# Patient Record
Sex: Male | Born: 1995 | Race: Black or African American | Hispanic: No | Marital: Single | Smoking: Current every day smoker
Health system: Southern US, Community
[De-identification: ages and names within clinical notes are randomized; demographics above are authoritative.]

## PROBLEM LIST (undated history)

## (undated) DIAGNOSIS — J45909 Unspecified asthma, uncomplicated: Secondary | ICD-10-CM

---
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
## 2016-06-30 IMAGING — CT CT TEMPORAL BONES WITHOUT CONTRAST
2 of 6 series · 12 of 40 positions shown, 15 images · non-contrast
Comparison: CT temporal bone 03/11/2014

CLINICAL DATA: Conductive hearing loss left ear. Postop left ear
surgery

EXAM:
CT TEMPORAL BONES WITHOUT CONTRAST
TECHNIQUE: Axial and coronal plane CT imaging of the petrous temporal bones was
performed with thin-collimation image reconstruction. No intravenous
contrast was administered. Multiplanar CT image reconstructions were
also generated.

[Series 4: coronal bone · coronal · 0.11mm/px · 2 of 146 slices shown]
[im 49/146  bone]
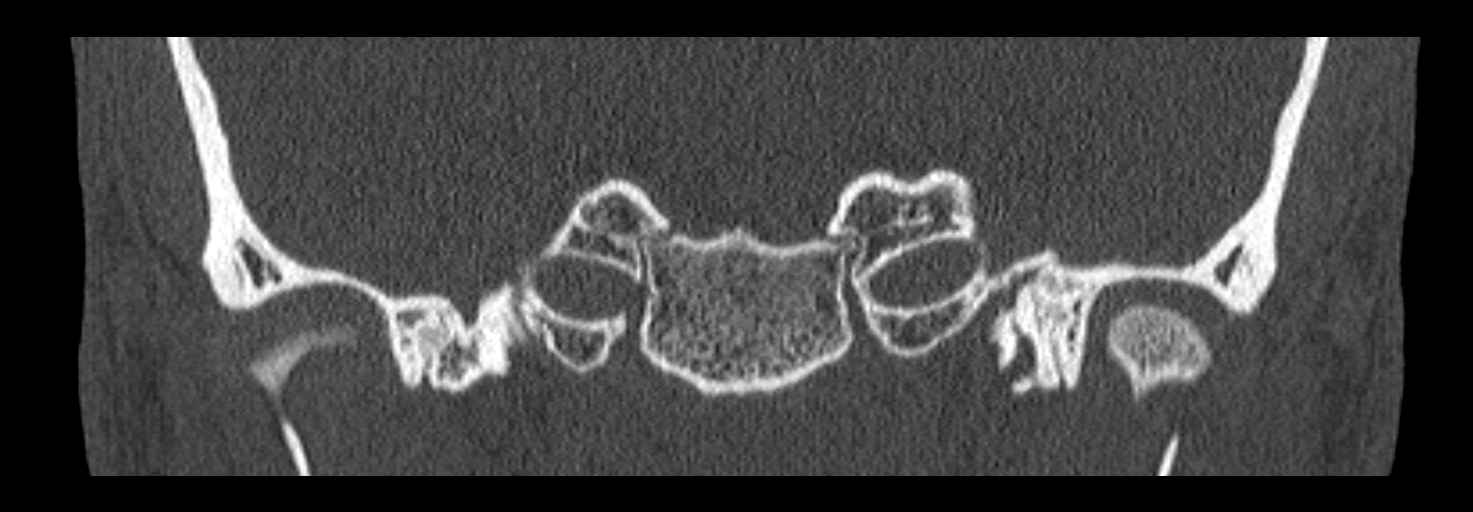
[im 97/146  bone]
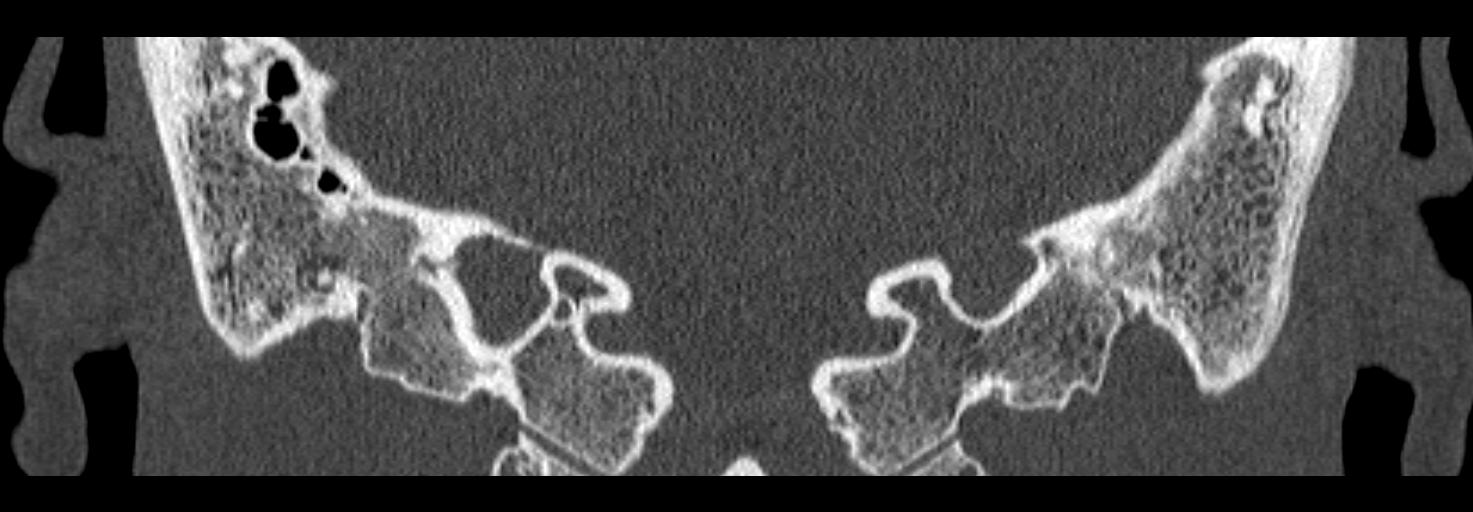

[Series 5: ax mag right · axial · 0.20mm/px · z∈[-204,-163]mm · 10 of 82 slices shown, 13 images]
[im 7/82  brain]
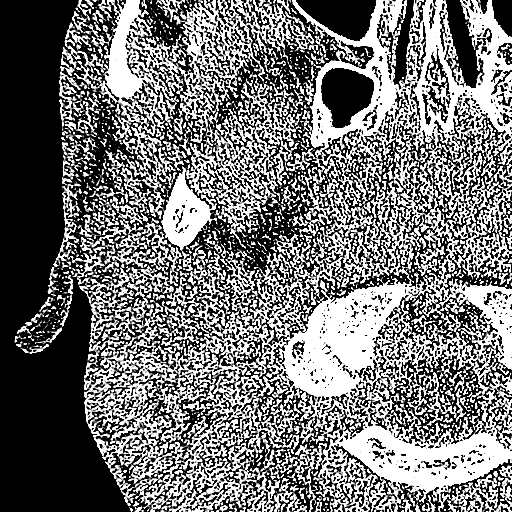
[im 7/82  bone]
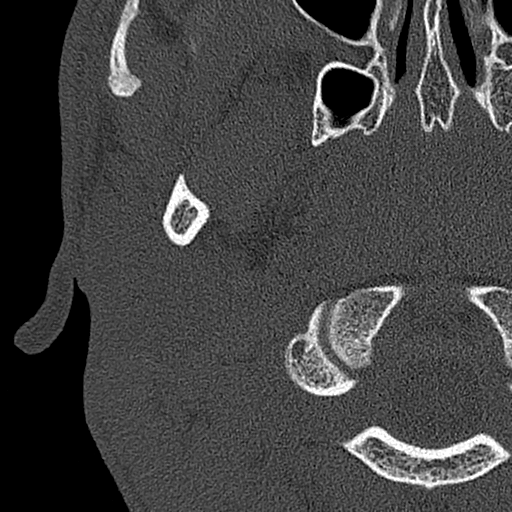
[im 14/82  bone]
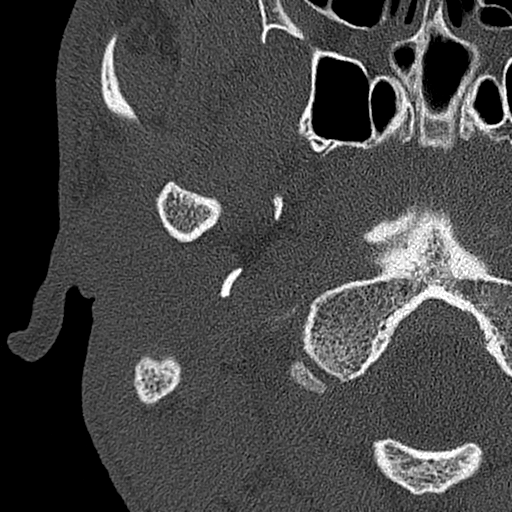
[im 21/82  bone]
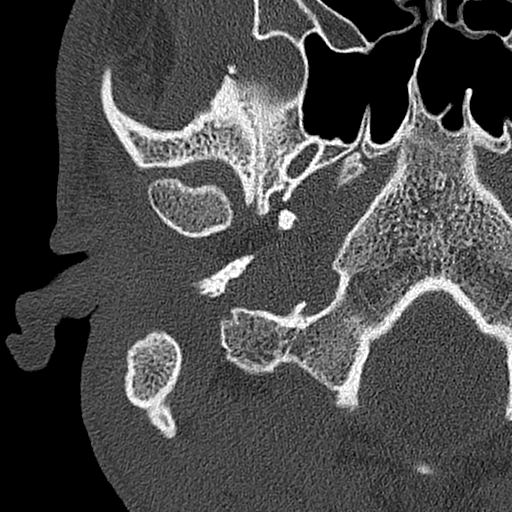
[im 28/82  bone]
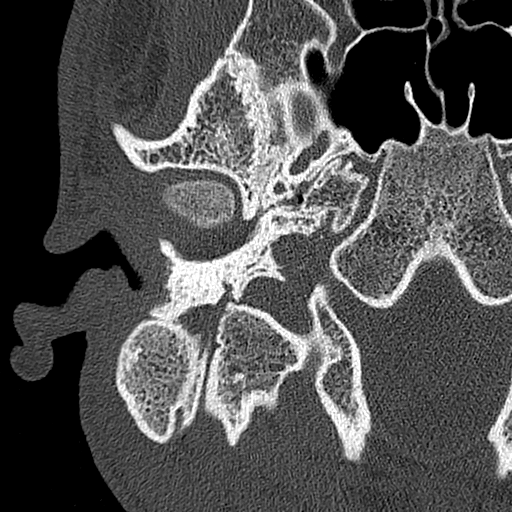
[im 34/82  brain]
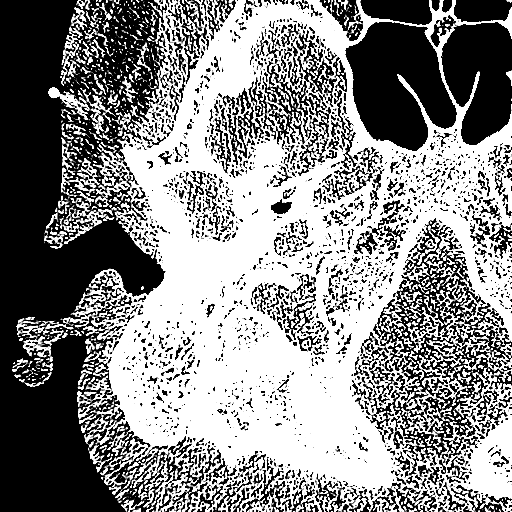
[im 34/82  bone]
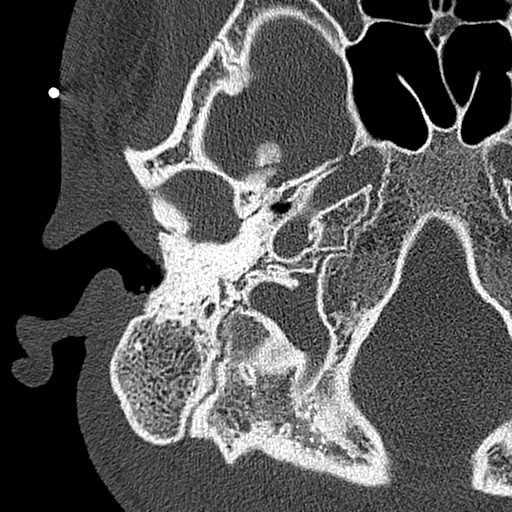
[im 48/82  bone]
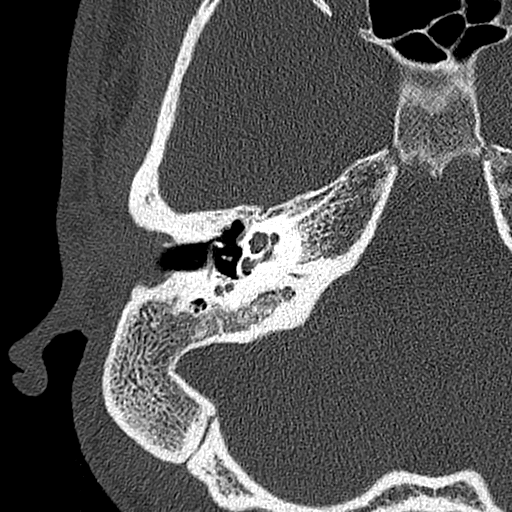
[im 55/82  bone]
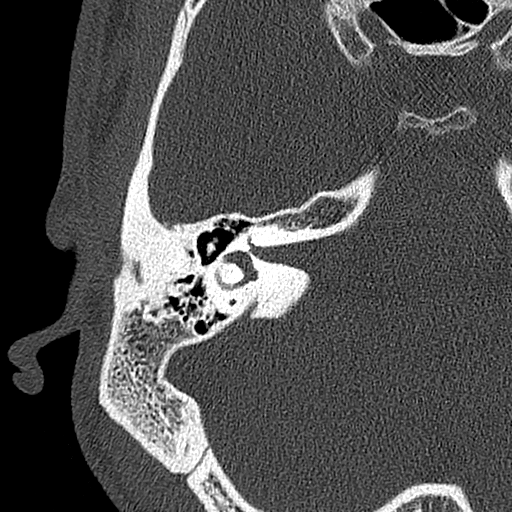
[im 61/82  bone]
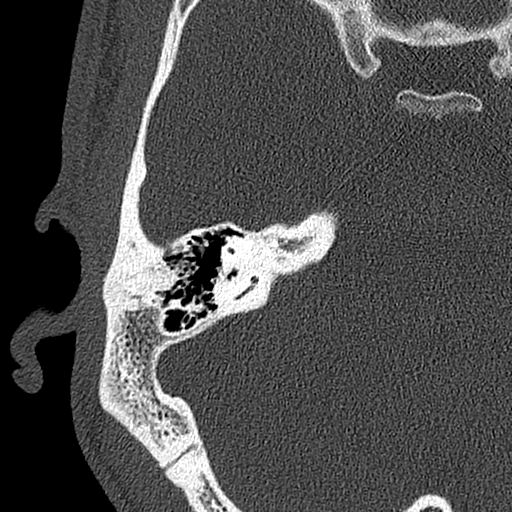
[im 68/82  brain]
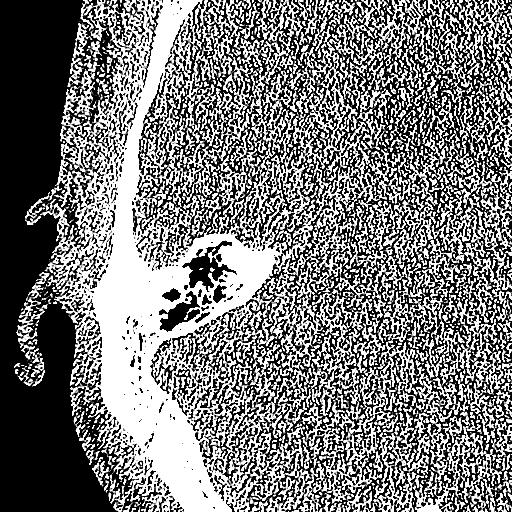
[im 68/82  bone]
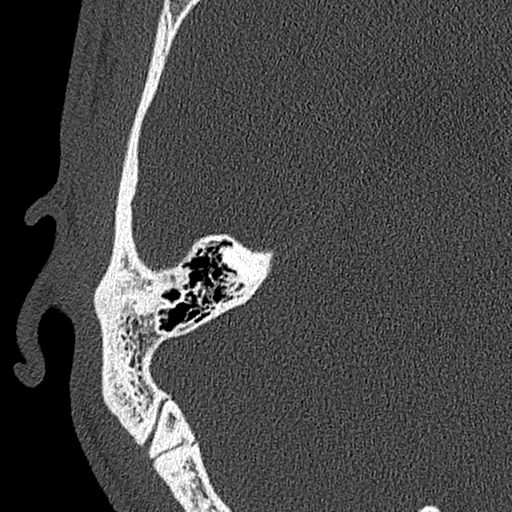
[im 75/82  bone]
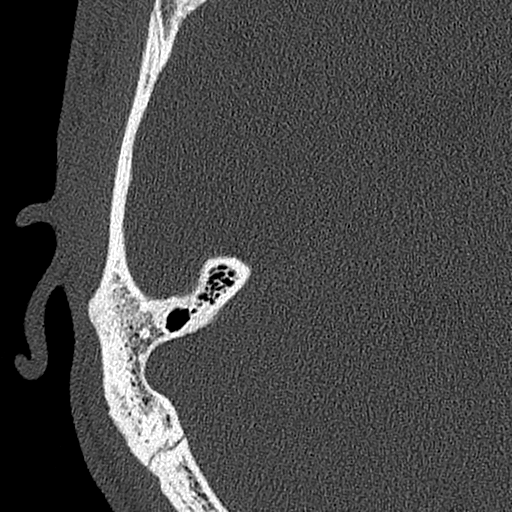

[12 of 40 positions shown; findings below may reference images not displayed]

FINDINGS: Mild mucosal edema in the ethmoid sinuses. No air-fluid level. No
bony destruction.

Right temporal bone: Right mastoid sinus is hypoplastic but aerated
without significant mucosal edema. This may suggests chronic
mastoiditis. Middle ear and ossicles are normal on the right. Inner
ear structures are normal.

Left temporal bone: Left mastoid sinus is hypoplastic. Small amount
of developed mastoid sinus on the left demonstrates mucosal edema.
There is soft tissue density in the middle ear and mastoid antrum.
No aerated mastoid sinus is present. Tympanic membrane is retracted
and thickened. Malleolus remains in normal position. Incus no longer
in normal position. There is a bony structure at the level of the
round window which may be a prosthesis or dislocated incus.
Correlate with prior history. No erosion of the scutum. Inner ear
structures are normal. Cochlea is normal. Internal auditory canal is
normal.
IMPRESSION: Soft tissue density remains in the middle ear extending into the
antrum of the mastoid sinus, unchanged. No bony destruction however
cholesteatoma not excluded.

Interval dislocation or removal of the incus. There is a new bony
structure at the round window which may be a prosthesis or
dislocated incus. Correlate with surgical procedure.

Right middle ear remains clear.

## 2017-08-09 ENCOUNTER — Emergency Department
Admission: EM | Admit: 2017-08-09 | Discharge: 2017-08-09 | Disposition: A | Discharge status: Home / Self Care | Payer: Self-pay | Attending: Emergency Medicine | Admitting: Emergency Medicine

## 2017-08-09 ENCOUNTER — Encounter: Payer: Self-pay | Admitting: *Deleted

## 2017-08-09 DIAGNOSIS — F17200 Nicotine dependence, unspecified, uncomplicated: Secondary | ICD-10-CM | POA: Insufficient documentation

## 2017-08-09 DIAGNOSIS — J029 Acute pharyngitis, unspecified: Secondary | ICD-10-CM | POA: Insufficient documentation

## 2017-08-09 DIAGNOSIS — J45909 Unspecified asthma, uncomplicated: Secondary | ICD-10-CM | POA: Insufficient documentation

## 2017-08-09 HISTORY — DX: Unspecified asthma, uncomplicated: J45.909

## 2017-08-09 MED ORDER — MAGIC MOUTHWASH W/LIDOCAINE: 5.0000 mL | Freq: Four times a day (QID) | ORAL | 0 refills | Status: AC

## 2017-08-09 NOTE — ED Triage Notes (Signed)
States sore throat for 2 days, increased pain when he tries to swallow

## 2017-08-09 NOTE — ED Provider Notes (Signed)
[facility] Emergency Department Provider Note   ____________________________________________   First MD Initiated Contact with Patient 08/09/17 0932     (approximate)  I have reviewed the triage vital signs and the nursing notes.   HISTORY  Chief Complaint Sore Throat    HPI Colin Richards is a 21 y.o. male patient complaining of 2 days of increasing sore throat. Patient stated pain increases with swallowing. Patient denies any other URI signs and symptoms. Patient denies nausea, vomiting, diarrhea. No palliative measures for complaint. Patient rates pain as a 3/10. Patient describes pain as "sore".   Past Medical History:  Diagnosis Date  . Asthma     There are no active problems to display for this patient.   History reviewed. No pertinent surgical history.  Prior to Admission medications   Medication Sig Start Date End Date Taking? Authorizing Provider  magic mouthwash w/lidocaine SOLN Take 5 mLs by mouth 4 (four) times daily. 15 seconds oral swish and swallow 08/09/17   Joni ReiningSmith, Ronald K, PA-C    Allergies Patient has no known allergies.  History reviewed. No pertinent family history.  Social History Social History  Substance Use Topics  . Smoking status: Current Every Day Smoker  . Smokeless tobacco: Never Used  . Alcohol use No    Review of Systems  Constitutional: No fever/chills Eyes: No visual changes. ENT: Sore throat Cardiovascular: Denies chest pain. Respiratory: Denies shortness of breath. Gastrointestinal: No abdominal pain.  No nausea, no vomiting.  No diarrhea.  No constipation. Genitourinary: Negative for dysuria. Musculoskeletal: Negative for back pain. Skin: Negative for rash. Neurological: Negative for headaches, focal weakness or numbness.   ____________________________________________   PHYSICAL EXAM:  VITAL SIGNS: ED Triage Vitals  Enc Vitals Group     BP 08/09/17 0912 124/81     Pulse Rate  08/09/17 0912 81     Resp 08/09/17 0912 16     Temp 08/09/17 0912 98 F (36.7 C)     Temp Source 08/09/17 0912 Oral     SpO2 08/09/17 0912 100 %     Weight 08/09/17 0910 170 lb (77.1 kg)     Height 08/09/17 0910 5\' 10"  (1.778 m)     Head Circumference --      Peak Flow --      Pain Score 08/09/17 0909 3     Pain Loc --      Pain Edu? --      Excl. in GC? --     Constitutional: Alert and oriented. Well appearing and in no acute distress. Nose: No congestion/rhinnorhea. Mouth/Throat: Mucous membranes are moist.  Oropharynx erythematous With non-exudative tonsils Neck: No stridor.  No cervical spine tenderness to palpation. Hematological/Lymphatic/Immunilogical: No cervical lymphadenopathy. Cardiovascular: Normal rate, regular rhythm. Grossly normal heart sounds.  Good peripheral circulation. Respiratory: Normal respiratory effort.  No retractions. Lungs CTAB. Neurologic:  Normal speech and language. No gross focal neurologic deficits are appreciated. No gait instability. Skin:  Skin is warm, dry and intact. No rash noted. Psychiatric: Mood and affect are normal. Speech and behavior are normal.  ____________________________________________   LABS (all labs ordered are listed, but only abnormal results are displayed)  Labs Reviewed - No data to display ____________________________________________  EKG   ____________________________________________  RADIOLOGY  No results found.  ____________________________________________   PROCEDURES  Procedure(s) performed: None  Procedures  Critical Care performed: No  ____________________________________________   INITIAL IMPRESSION / ASSESSMENT AND PLAN / ED COURSE  Pertinent labs &  imaging results that were available during my care of the patient were reviewed by me and considered in my medical decision making (see chart for details).  Sore throat secondary to viral pharyngitis. Patient given discharge Instructions.  Patient advised to medication as directed. Patient advised follow "clinic condition persists.      ____________________________________________   FINAL CLINICAL IMPRESSION(S) / ED DIAGNOSES  Final diagnoses:  Viral pharyngitis      NEW MEDICATIONS STARTED DURING THIS VISIT:  New Prescriptions   MAGIC MOUTHWASH W/LIDOCAINE SOLN    Take 5 mLs by mouth 4 (four) times daily. 15 seconds oral swish and swallow     Note:  This document was prepared using Dragon voice recognition software and may include unintentional dictation errors.    Joni Reining, PA-C 08/09/17 1610    Jene Every, MD 08/09/17 [phone]
